# Patient Record
Sex: Female | Born: 1985 | Race: Black or African American | Hispanic: No | Marital: Single | Smoking: Never smoker
Health system: Southern US, Community
[De-identification: ages and names within clinical notes are randomized; demographics above are authoritative.]

## PROBLEM LIST (undated history)

## (undated) DIAGNOSIS — I82409 Acute embolism and thrombosis of unspecified deep veins of unspecified lower extremity: Secondary | ICD-10-CM

## (undated) DIAGNOSIS — J45909 Unspecified asthma, uncomplicated: Secondary | ICD-10-CM

---

## 2005-05-29 ENCOUNTER — Emergency Department (HOSPITAL_COMMUNITY): Admission: EM | Admit: 2005-05-29 | Discharge: 2005-05-29 | Payer: Self-pay | Admitting: Emergency Medicine

## 2006-08-26 ENCOUNTER — Ambulatory Visit (HOSPITAL_COMMUNITY): Admission: RE | Admit: 2006-08-26 | Discharge: 2006-08-26 | Payer: Self-pay | Admitting: Allergy and Immunology

## 2009-08-31 ENCOUNTER — Emergency Department (HOSPITAL_COMMUNITY): Admission: EM | Admit: 2009-08-31 | Discharge: 2009-08-31 | Payer: Self-pay | Admitting: Emergency Medicine

## 2009-10-24 ENCOUNTER — Emergency Department (HOSPITAL_COMMUNITY): Admission: EM | Admit: 2009-10-24 | Discharge: 2009-10-24 | Payer: Self-pay | Admitting: Emergency Medicine

## 2014-05-17 ENCOUNTER — Ambulatory Visit (HOSPITAL_COMMUNITY)
Admission: RE | Admit: 2014-05-17 | Discharge: 2014-05-17 | Disposition: A | Discharge status: Home / Self Care | Payer: BC Managed Care – PPO | Source: Ambulatory Visit | Attending: Obstetrics | Admitting: Obstetrics

## 2014-05-17 ENCOUNTER — Other Ambulatory Visit (HOSPITAL_COMMUNITY): Payer: Self-pay | Admitting: Obstetrics

## 2014-05-17 DIAGNOSIS — M795 Residual foreign body in soft tissue: Secondary | ICD-10-CM

## 2014-05-17 DIAGNOSIS — R252 Cramp and spasm: Secondary | ICD-10-CM

## 2014-05-17 DIAGNOSIS — I824Y9 Acute embolism and thrombosis of unspecified deep veins of unspecified proximal lower extremity: Secondary | ICD-10-CM | POA: Insufficient documentation

## 2014-05-17 DIAGNOSIS — I824Z9 Acute embolism and thrombosis of unspecified deep veins of unspecified distal lower extremity: Secondary | ICD-10-CM | POA: Insufficient documentation

## 2014-05-17 NOTE — Progress Notes (Signed)
*  Preliminary Results* Right lower extremity venous duplex completed. Right lower extremity is positive for deep vein thrombosis involving the right femoral, right profunda femoral, right popliteal, and the right peroneal veins. There is no evidence of right Baker's cyst.  Preliminary results discussed with Dr.Fogleman; she has spoken with the patient and she has been instructed to return home and await further instruction. I have warned the patient of pulmonary embolism symptoms, and told her that if she feels any shortness of breath, chest pain, etc. to immediately go to the emergency department.    05/17/2014 4:10 PM  Gertie FeyMichelle Simonetti, RVT, RDCS, RDMS

## 2014-05-22 ENCOUNTER — Other Ambulatory Visit: Payer: Self-pay

## 2014-05-22 ENCOUNTER — Emergency Department (HOSPITAL_COMMUNITY): Payer: BC Managed Care – PPO

## 2014-05-22 ENCOUNTER — Encounter (HOSPITAL_COMMUNITY): Payer: Self-pay | Admitting: Emergency Medicine

## 2014-05-22 ENCOUNTER — Emergency Department (HOSPITAL_COMMUNITY)
Admission: EM | Admit: 2014-05-22 | Discharge: 2014-05-22 | Disposition: A | Discharge status: Home / Self Care | Payer: BC Managed Care – PPO | Attending: Emergency Medicine | Admitting: Emergency Medicine

## 2014-05-22 DIAGNOSIS — Z7901 Long term (current) use of anticoagulants: Secondary | ICD-10-CM | POA: Insufficient documentation

## 2014-05-22 DIAGNOSIS — Z3202 Encounter for pregnancy test, result negative: Secondary | ICD-10-CM | POA: Insufficient documentation

## 2014-05-22 DIAGNOSIS — E669 Obesity, unspecified: Secondary | ICD-10-CM | POA: Insufficient documentation

## 2014-05-22 DIAGNOSIS — Z86718 Personal history of other venous thrombosis and embolism: Secondary | ICD-10-CM | POA: Insufficient documentation

## 2014-05-22 DIAGNOSIS — Z79899 Other long term (current) drug therapy: Secondary | ICD-10-CM | POA: Insufficient documentation

## 2014-05-22 DIAGNOSIS — R079 Chest pain, unspecified: Secondary | ICD-10-CM

## 2014-05-22 DIAGNOSIS — J45909 Unspecified asthma, uncomplicated: Secondary | ICD-10-CM | POA: Insufficient documentation

## 2014-05-22 HISTORY — DX: Unspecified asthma, uncomplicated: J45.909

## 2014-05-22 HISTORY — DX: Acute embolism and thrombosis of unspecified deep veins of unspecified lower extremity: I82.409

## 2014-05-22 LAB — CBC
HCT: 38.7 % (ref 36.0–46.0)
HEMOGLOBIN: 12.2 g/dL (ref 12.0–15.0)
MCH: 26.3 pg (ref 26.0–34.0)
MCHC: 31.5 g/dL (ref 30.0–36.0)
MCV: 83.6 fL (ref 78.0–100.0)
PLATELETS: 236 10*3/uL (ref 150–400)
RBC: 4.63 MIL/uL (ref 3.87–5.11)
RDW: 15.2 % (ref 11.5–15.5)
WBC: 6.3 10*3/uL (ref 4.0–10.5)

## 2014-05-22 LAB — I-STAT TROPONIN, ED: Troponin i, poc: 0 ng/mL (ref 0.00–0.08)

## 2014-05-22 LAB — BASIC METABOLIC PANEL
BUN: 11 mg/dL (ref 6–23)
CALCIUM: 9 mg/dL (ref 8.4–10.5)
CO2: 28 meq/L (ref 19–32)
CREATININE: 0.89 mg/dL (ref 0.50–1.10)
Chloride: 103 mEq/L (ref 96–112)
GFR calc Af Amer: >90 mL/min (ref >90)
GFR, EST NON AFRICAN AMERICAN: 87 mL/min — AB (ref >90)
Glucose, Bld: 85 mg/dL (ref 70–99)
Potassium: 4.1 mEq/L (ref 3.7–5.3)
SODIUM: 142 meq/L (ref 137–147)

## 2014-05-22 LAB — POC URINE PREG, ED: Preg Test, Ur: NEGATIVE

## 2014-05-22 NOTE — Discharge Instructions (Signed)
Continue to take all your medications as prescribed.  See below for further instructions.    Chest Pain (Nonspecific) It is often hard to give a diagnosis for the cause of chest pain. There is always a chance that your pain could be related to something serious, such as a heart attack or a blood clot in the lungs. You need to follow up with your doctor. HOME CARE  If antibiotic medicine was given, take it as directed by your doctor. Finish the medicine even if you start to feel better.  For the next few days, avoid activities that bring on chest pain. Continue physical activities as told by your doctor.  Do not use any tobacco products. This includes cigarettes, chewing tobacco, and e-cigarettes.  Avoid drinking alcohol.  Only take medicine as told by your doctor.  Follow your doctor's suggestions for more testing if your chest pain does not go away.  Keep all doctor visits you made. GET HELP IF:  Your chest pain does not go away, even after treatment.  You have a rash with blisters on your chest.  You have a fever. GET HELP RIGHT AWAY IF:   You have more pain or pain that spreads to your arm, neck, jaw, back, or belly (abdomen).  You have shortness of breath.  You cough more than usual or cough up blood.  You have very bad back or belly pain.  You feel sick to your stomach (nauseous) or throw up (vomit).  You have very bad weakness.  You pass out (faint).  You have chills. This is an emergency. Do not wait to see if the problems will go away. Call your local emergency services (911 in [place] Do not drive yourself to the hospital. MAKE SURE YOU:   Understand these instructions.  Will watch your condition.  Will get help right away if you are not doing well or get worse. Document Released: 04/27/2008 Document Revised: 11/14/2013 Document Reviewed: 04/27/2008 [facility] Patient Information 2015 [place], [place]. This information is not intended to replace advice given  to you by your health care provider. Make sure you discuss any questions you have with your health care provider.

## 2014-05-22 NOTE — ED Notes (Signed)
Presents with known blood clot in right leg DX Thursday-from ankle to hip-taking Xarelto . YEsterday began having pain with deep inspiration and today pain is worse and constant in right chest. Denies dizziness and Nausea. Reports wanting to take shallow respirations due to pain. Sats 99% RA.

## 2014-05-22 NOTE — ED Provider Notes (Signed)
CSN: [id number]     Arrival date & time 05/22/14  1619 History   First MD Initiated Contact with Patient 05/22/14 1632     Chief Complaint  Patient presents with  . Chest Pain     (Consider location/radiation/quality/duration/timing/severity/associated sxs/prior Treatment) HPI Pt is a 28yo female dx with DVT in right leg from ankle to hip on Thursday, 6/25, started on Xarelto, presenting today c/o constant right sided chest pain that is sharp in nature, worse with deep inspiration.  Pain started yesterday, but worse today.  Pt denies dizziness, fever, n/v/d. Denies difficulty breathing, just increased pain.  Pt states she has the urge to take shallow respirations due to pain.  Denies hx of CAD.  Does report hx of asthma but states this feels different. Pt does report right leg has improved. States swelling and pain have improved since Thursday.  States she had been taking birth control and does travel driving 3 hours or more at a time a lot but denies any other possible causes of blood clot.  Pt reports taking percocet and acetaminophen as needed for leg pain. Last percocet was last night.    Past Medical History  Diagnosis Date  . DVT (deep venous thrombosis)   . Asthma    History reviewed. No pertinent past surgical history. History reviewed. No pertinent family history. History  Substance Use Topics  . Smoking status: Never Smoker   . Smokeless tobacco: Not on file  . Alcohol Use: No   OB History   Grav Para Term Preterm Abortions TAB SAB Ect Mult Living                 Review of Systems  Constitutional: Negative for fever and chills.  Respiratory: Negative for cough and shortness of breath.   Cardiovascular: Positive for chest pain. Negative for palpitations and leg swelling.  Gastrointestinal: Negative for nausea, vomiting, abdominal pain, diarrhea and constipation.  Musculoskeletal: Negative for arthralgias, back pain, joint swelling and myalgias.  All other systems  reviewed and are negative.     Allergies  Review of patient's allergies indicates no known allergies.  Home Medications   Prior to Admission medications   Medication Sig Start Date End Date Taking? Authorizing Provider  acetaminophen (TYLENOL) 500 MG tablet Take 1,000 mg by mouth every 6 (six) hours as needed (pain).   Yes Historical Provider, MD  diphenhydrAMINE (BENADRYL) 50 MG tablet Take 50 mg by mouth every evening. For allergies/sleep   Yes Historical Provider, MD  oxyCODONE-acetaminophen (PERCOCET/ROXICET) 5-325 MG per tablet Take 1-2 tablets by mouth every 4 (four) hours as needed for severe pain (pain).   Yes Historical Provider, MD  Rivaroxaban (XARELTO) 15 MG TABS tablet Take 15 mg by mouth 2 (two) times daily with a meal.   Yes Historical Provider, MD   BP 124/80  Pulse 83  Temp(Src) 98.9 F (37.2 C) (Oral)  Resp 16  Ht 5' 9.5" (1.765 m)  Wt 240 lb (108.863 kg)  BMI 34.95 kg/m2  SpO2 99%  LMP 05/21/2014 Physical Exam  Nursing note and vitals reviewed. Constitutional: She appears well-developed and well-nourished. No distress.  Obese female lying in exam bed, NAD  HENT:  Head: Normocephalic and atraumatic.  Eyes: Conjunctivae are normal. No scleral icterus.  Neck: Normal range of motion.  Cardiovascular: Normal rate, regular rhythm and normal heart sounds.   Pulses:      Dorsalis pedis pulses are 2+ on the right side, and 2+ on the left side.  Posterior tibial pulses are 2+ on the right side, and 2+ on the left side.  Regular rate and rhythm   Pulmonary/Chest: Effort normal and breath sounds normal. No respiratory distress. She has no wheezes. She has no rales. She exhibits no tenderness.  No respiratory distress, able to speak in full sentences w/o difficulty. Lungs: CTAB. No chest wall tenderness   Abdominal: Soft. Bowel sounds are normal. She exhibits no distension and no mass. There is no tenderness. There is no rebound and no guarding.  Musculoskeletal:  Normal range of motion. She exhibits no edema and no tenderness.  Bilateral legs: no erythema, edema, or tenderness. FROM   Neurological: She is alert.  Skin: Skin is warm and dry. She is not diaphoretic. No erythema.    ED Course  Procedures (including critical care time) Labs Review Labs Reviewed  BASIC METABOLIC PANEL - Abnormal; Notable for the following:    GFR calc non Af Amer 87 (*)    All other components within normal limits  CBC  I-STAT TROPOININ, ED  POC URINE PREG, ED    Imaging Review Dg Chest 2 View  05/22/2014   CLINICAL DATA:  Right-sided chest pain  EXAM: CHEST  2 VIEW  COMPARISON:  05/29/2005  FINDINGS: The heart size and mediastinal contours are within normal limits. Both lungs are clear. The visualized skeletal structures are unremarkable.  IMPRESSION: No active cardiopulmonary disease.   Electronically Signed   By: Esperanza Heiraymond  Rubner M.D.   On: 05/22/2014 18:09     EKG Interpretation None      MDM   Final diagnoses:  Chest pain, unspecified chest pain type    Pt is a 28yo female with hx of DVT in right leg dx on Thursday, 6/25, started on Xarelto.  Pt states she has been taking it as prescribed as well as percocet and acetaminophen as needed for pain.  Pt c/o right sided chest pain that started yesterday, worse with deep inspiration but denies SOB.  Vitals: WNL, afebrile, HR-84, Resp: 20, O2: 99% on RA.  On exam, pt appears well. No respiratory distress. Lungs: CTAB.  Legs-no tenderness or edema bilaterally.  Labs: CBC, BMP, and Troponin: WNL. CXR: no active cardiopulmonary disease.  Discussed pt with Dr. Jeraldine LootsLockwood, pt may be discharged home advised to continued percocet and acetaminophen as needed for pain. Encouraged to stay well hydrated, well rested, but also walk at least 30 minutes a day.  Discussed option of CT angio chest, however, even if pt was found to have a small Pulmonary Embolism, tx plan would not change as pt was recently started on Xarelto and her  Vitals are WNL, no hypoxia, no respiratory distress.  Pt may be discharged home with strict return precautions.  Pt verbalized understanding and agreement with tx plan.     Junius Finnerrin O'Malley, PA-C 05/22/14 1911

## 2014-05-23 NOTE — ED Provider Notes (Signed)
  This was a shared visit with a mid-level provided (NP or PA).  Throughout the patient's course I was available for consultation/collaboration.  I saw the relevant labs and studies - I agree with the interpretation.  On my exam the patient was in no distress.      This young female with recently diagnosed DVT now presents with right-sided chest pain. On exam patient awake alert, hemodynamically stable, in no distress. Patient unremarkable cardiac monitor, heart rate in the 80s, was not hypoxic, tachypneic, tachycardic. Though the patient does have evidence of DVT, recently diagnosed, and they have PE, there is no evidence for hemodynamic compromise, imminent decompensation, or other substantial acute new process.  Pain may reflect subacute PE, but more likely reflects disease progression, possibly with scarring. Absence of distress, patient is oriented in the appropriate medication for PE/DVT, additional imaging is not indicated, given the lack of probable change in therapy and downside of exposure to contrast dye. Patient was discharged in stable condition with primary care followup.   Gerhard Munchobert Lockwood, MD 05/23/14 [phone]

## 2015-02-05 ENCOUNTER — Other Ambulatory Visit (HOSPITAL_COMMUNITY): Payer: Self-pay | Admitting: Obstetrics

## 2015-02-05 DIAGNOSIS — I82401 Acute embolism and thrombosis of unspecified deep veins of right lower extremity: Secondary | ICD-10-CM

## 2015-02-07 ENCOUNTER — Ambulatory Visit (HOSPITAL_COMMUNITY)
Admission: RE | Admit: 2015-02-07 | Discharge: 2015-02-07 | Disposition: A | Discharge status: Home / Self Care | Payer: BC Managed Care – PPO | Source: Ambulatory Visit | Attending: Obstetrics | Admitting: Obstetrics

## 2015-02-07 DIAGNOSIS — I82401 Acute embolism and thrombosis of unspecified deep veins of right lower extremity: Secondary | ICD-10-CM | POA: Diagnosis not present

## 2015-02-07 NOTE — Progress Notes (Addendum)
VASCULAR LAB PRELIMINARY  PRELIMINARY  PRELIMINARY  PRELIMINARY  Right lower extremity venous Doppler completed.    Preliminary report:  Previous DVT found 04/2014 appears resolved.    Rether Rison, RVT 02/07/2015, 3:28 PM

## 2016-02-21 IMAGING — CR DG CHEST 2V
2 series · 2 of 2 positions shown · non-contrast
Comparison: 05/29/2005

CLINICAL DATA: Right-sided chest pain

EXAM:
CHEST  2 VIEW

[w chest pa]
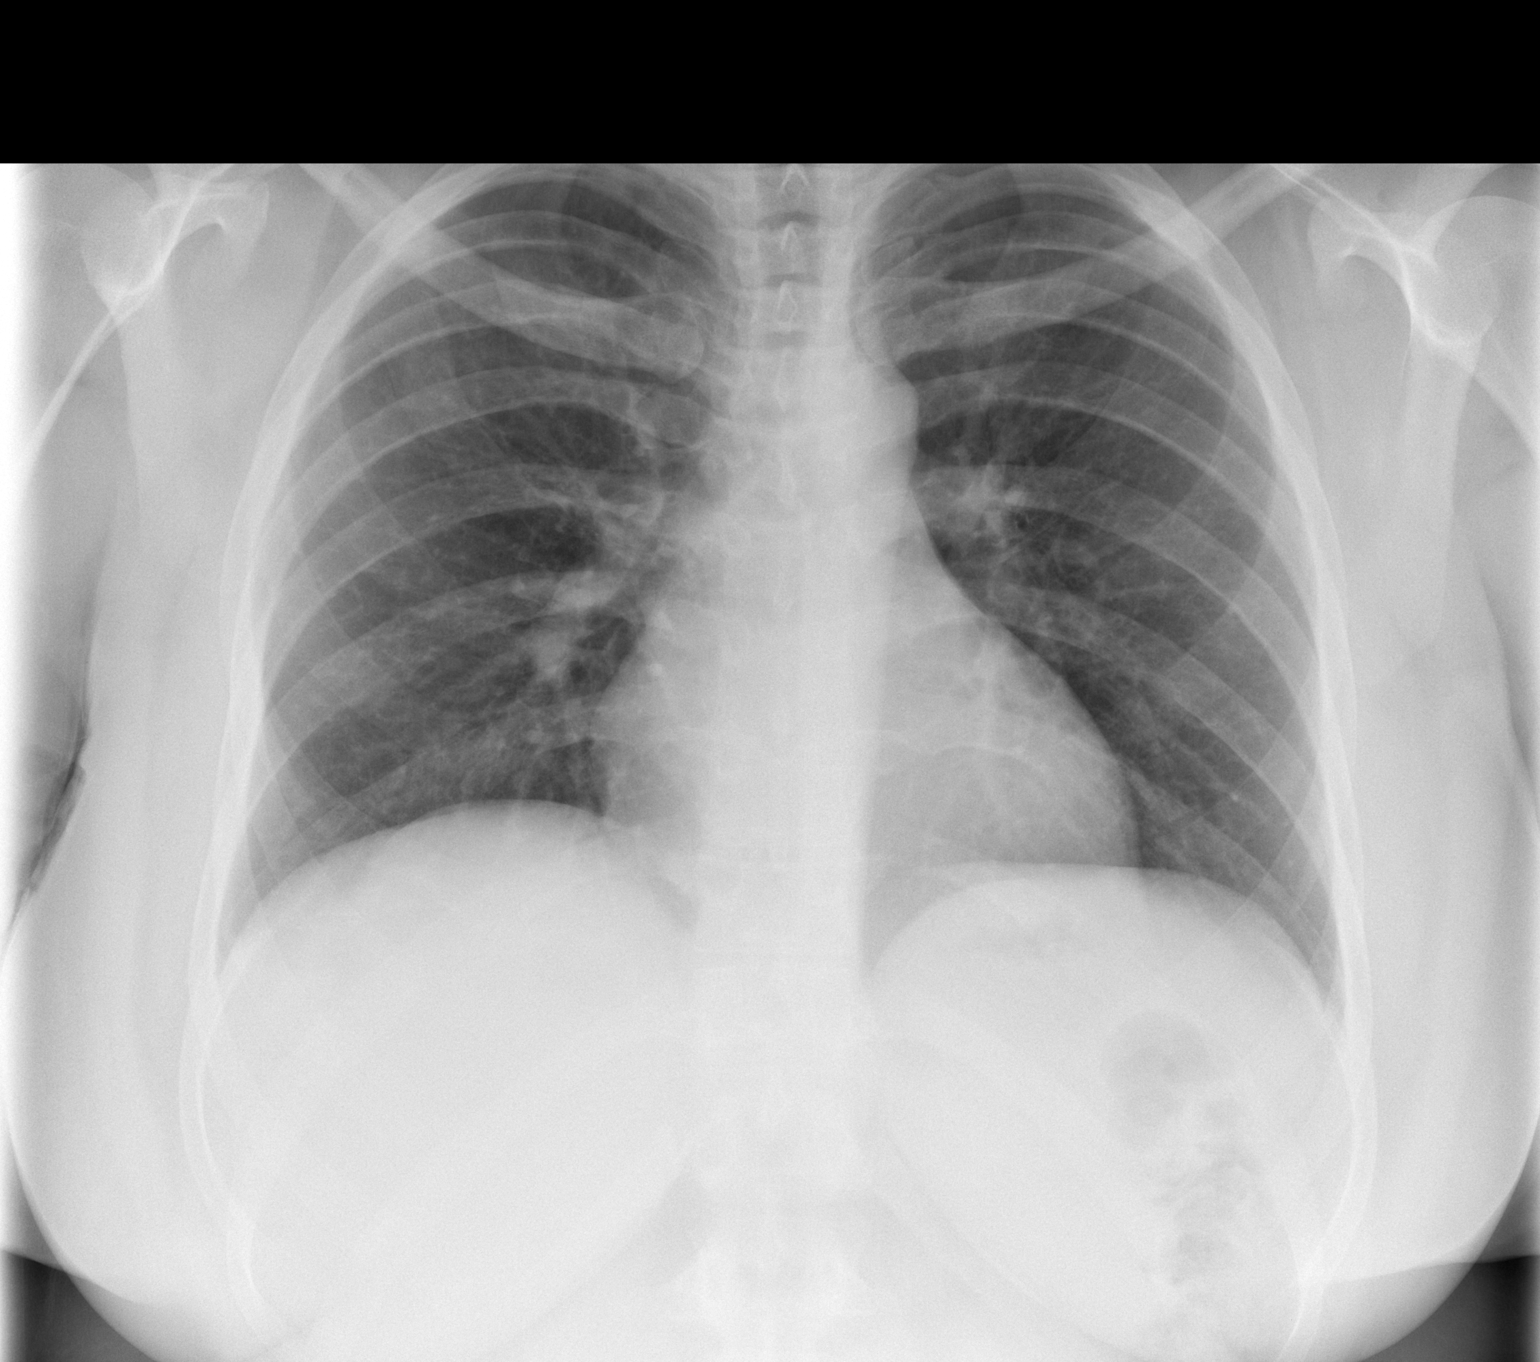

[w chest lat]
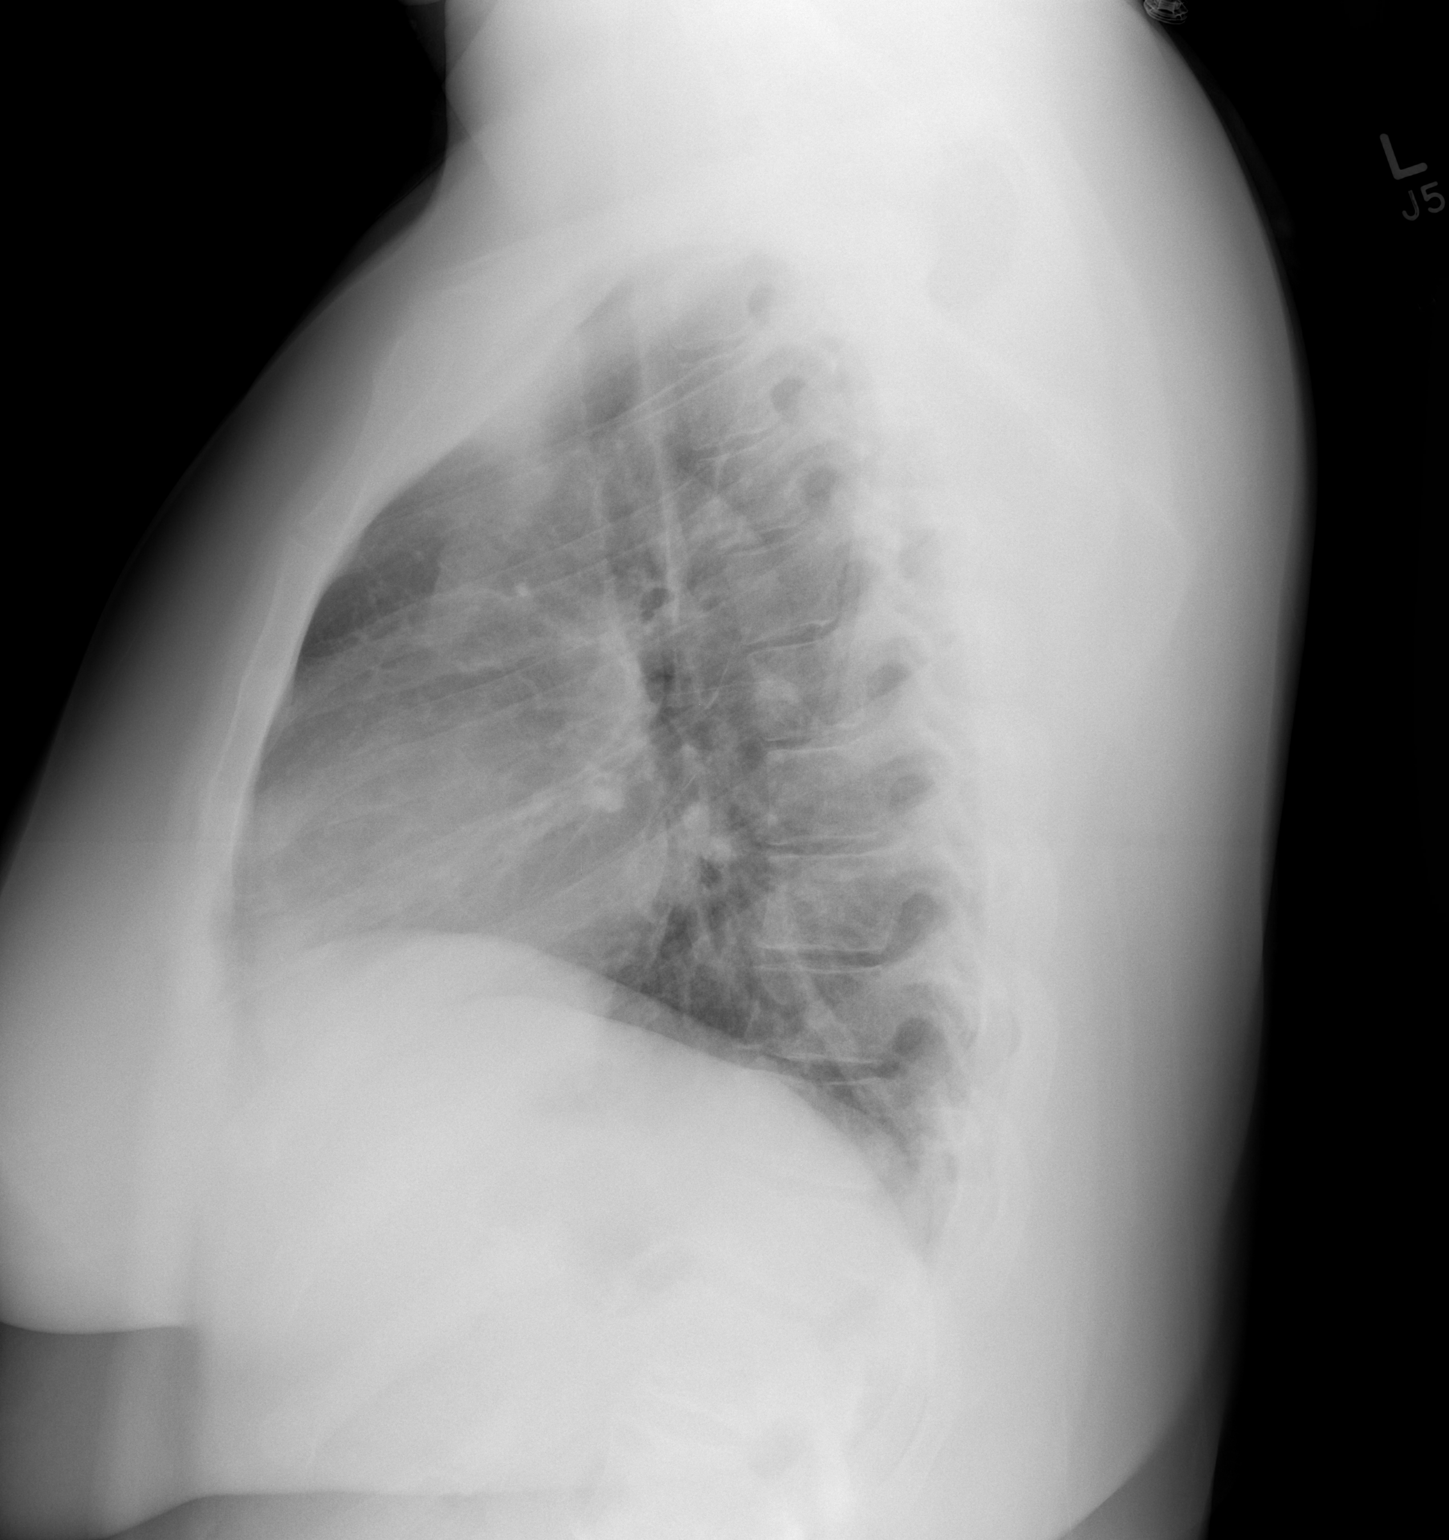

[2 of 2 positions shown; findings below may reference images not displayed]

FINDINGS: The heart size and mediastinal contours are within normal limits.
Both lungs are clear. The visualized skeletal structures are
unremarkable.
IMPRESSION: No active cardiopulmonary disease.
